# Patient Record
Sex: Male | Born: 1995 | Race: Black or African American | Hispanic: No | Marital: Single | Smoking: Former smoker
Health system: Southern US, Community
[De-identification: ages and names within clinical notes are randomized; demographics above are authoritative.]

## PROBLEM LIST (undated history)

## (undated) DIAGNOSIS — M1990 Unspecified osteoarthritis, unspecified site: Secondary | ICD-10-CM

---

## 2017-10-24 ENCOUNTER — Emergency Department (HOSPITAL_COMMUNITY): Admission: EM | Admit: 2017-10-24 | Discharge: 2017-10-24 | Discharge status: Absent without leave | Payer: Self-pay

## 2017-10-25 ENCOUNTER — Emergency Department (HOSPITAL_BASED_OUTPATIENT_CLINIC_OR_DEPARTMENT_OTHER)
Admission: EM | Admit: 2017-10-25 | Discharge: 2017-10-25 | Disposition: A | Discharge status: Home / Self Care | Payer: BLUE CROSS/BLUE SHIELD | Attending: Emergency Medicine | Admitting: Emergency Medicine

## 2017-10-25 ENCOUNTER — Emergency Department (HOSPITAL_BASED_OUTPATIENT_CLINIC_OR_DEPARTMENT_OTHER): Payer: BLUE CROSS/BLUE SHIELD

## 2017-10-25 ENCOUNTER — Encounter (HOSPITAL_BASED_OUTPATIENT_CLINIC_OR_DEPARTMENT_OTHER): Payer: Self-pay | Admitting: Emergency Medicine

## 2017-10-25 ENCOUNTER — Other Ambulatory Visit: Payer: Self-pay

## 2017-10-25 DIAGNOSIS — S6992XA Unspecified injury of left wrist, hand and finger(s), initial encounter: Secondary | ICD-10-CM | POA: Diagnosis present

## 2017-10-25 DIAGNOSIS — X509XXA Other and unspecified overexertion or strenuous movements or postures, initial encounter: Secondary | ICD-10-CM | POA: Insufficient documentation

## 2017-10-25 DIAGNOSIS — Y9371 Activity, boxing: Secondary | ICD-10-CM | POA: Diagnosis not present

## 2017-10-25 DIAGNOSIS — Y998 Other external cause status: Secondary | ICD-10-CM | POA: Insufficient documentation

## 2017-10-25 DIAGNOSIS — Y9239 Other specified sports and athletic area as the place of occurrence of the external cause: Secondary | ICD-10-CM | POA: Diagnosis not present

## 2017-10-25 DIAGNOSIS — S63502A Unspecified sprain of left wrist, initial encounter: Secondary | ICD-10-CM | POA: Diagnosis not present

## 2017-10-25 MED ORDER — IBUPROFEN 600 MG PO TABS: 600.0000 mg | ORAL_TABLET | Freq: Four times a day (QID) | ORAL | 0 refills | Status: DC | PRN

## 2017-10-25 NOTE — Discharge Instructions (Signed)
Rest and elevate the affected painful area and keep it immobilized with the sprain.  Apply cold compresses 4 times daily for the next week and complete the course of ibuprofen for pain.  As pain recedes, begin normal activities slowly as tolerated. See the sports medicine doctor if the pain doesn't improve.

## 2017-10-25 NOTE — ED Provider Notes (Signed)
[facility] EMERGENCY DEPARTMENT Provider Note   CSN: [id number] Arrival date & time: 10/25/17  0003     History   Chief Complaint Chief Complaint  Patient presents with  . Wrist Pain    HPI Jared Norris is a 21 y.o. male.  HPI  Patient comes in with chief complaint of wrist injury. Patient injured his wrist while boxing this evening.  About 2 hours after the injury the pain got intense.  Patient has no numbness or tingling.  Pain is located over the distal part of the forearm.  Patient is left-handed.  History reviewed. No pertinent past medical history.  There are no active problems to display for this patient.   History reviewed. No pertinent surgical history.     Home Medications    Prior to Admission medications   Medication Sig Start Date End Date Taking? Authorizing Provider  ibuprofen (ADVIL,MOTRIN) 600 MG tablet Take 1 tablet (600 mg total) every 6 (six) hours as needed by mouth. 10/25/17   Derwood KaplanNanavati, Rickey Farrier, MD    Family History No family history on file.  Social History Social History   Tobacco Use  . Smoking status: Never Smoker  . Smokeless tobacco: Never Used  Substance Use Topics  . Alcohol use: No    Frequency: Never  . Drug use: No     Allergies   Patient has no known allergies.   Review of Systems Review of Systems  Constitutional: Positive for activity change.  Musculoskeletal: Positive for arthralgias and joint swelling.  Hematological: Does not bruise/bleed easily.     Physical Exam Updated Vital Signs BP 121/79 (BP Location: Right Arm)   Pulse 60   Temp 98.3 F (36.8 C) (Oral)   Resp 18   SpO2 100%   Physical Exam  Constitutional: He is oriented to person, place, and time. He appears well-developed.  HENT:  Head: Atraumatic.  Eyes: EOM are normal.  Neck: Neck supple.  Cardiovascular: Normal rate and intact distal pulses.  Pulmonary/Chest: Effort normal.  Musculoskeletal: He exhibits edema and  tenderness. He exhibits no deformity.  Patient has mild tenderness over the dorsum of the distal forearm, towards the ulnar styloid.  Patient has tenderness to palpation over that region.  Patient is able to supinate, but has pain with pronation.   Neurological: He is alert and oriented to person, place, and time.  Skin: Skin is warm.  Nursing note and vitals reviewed.    ED Treatments / Results  Labs (all labs ordered are listed, but only abnormal results are displayed) Labs Reviewed - No data to display  EKG  EKG Interpretation None       Radiology Dg Wrist Complete Left  Result Date: 10/25/2017 CLINICAL DATA:  Pain after boxing EXAM: LEFT WRIST - COMPLETE 3+ VIEW COMPARISON:  None. FINDINGS: There is no evidence of fracture or dislocation. There is no evidence of arthropathy or other focal bone abnormality. Soft tissues are unremarkable. IMPRESSION: Negative. Electronically Signed   By: Jasmine PangKim  Fujinaga M.D.   On: 10/25/2017 00:23    Procedures Procedures (including critical care time)  Medications Ordered in ED Medications - No data to display   Initial Impression / Assessment and Plan / ED Course  I have reviewed the triage vital signs and the nursing notes.  Pertinent labs & imaging results that were available during my care of the patient were reviewed by me and considered in my medical decision making (see chart for details).     Patient  comes in after injuring his wrist from a boxing related injury. Patient's tenderness is limited to the dorsal part of the distal forearm/wrist area.  There is no gross deformity, mild edema appreciated.  Patient is neurovascularly intact.  X-ray does not show any acute fractures.  We will treat with RICE. F/u if not getting better.   Final Clinical Impressions(s) / ED Diagnoses   Final diagnoses:  Sprain of left wrist, initial encounter    ED Discharge Orders        Ordered    ibuprofen (ADVIL,MOTRIN) 600 MG tablet  Every 6  hours PRN     10/25/17 0056       Derwood KaplanNanavati, Kellyn Mansfield, MD 10/25/17 [id number]

## 2017-10-25 NOTE — ED Triage Notes (Signed)
Pt presents with c/o pain to left wrist after boxing tonight

## 2017-10-25 NOTE — ED Notes (Signed)
Pt verbalizes understanding of d/c instructions and denies any further need at this time. 

## 2018-09-16 ENCOUNTER — Emergency Department (HOSPITAL_COMMUNITY)
Admission: EM | Admit: 2018-09-16 | Discharge: 2018-09-16 | Disposition: A | Discharge status: Home / Self Care | Payer: BLUE CROSS/BLUE SHIELD | Attending: Emergency Medicine | Admitting: Emergency Medicine

## 2018-09-16 ENCOUNTER — Other Ambulatory Visit: Payer: Self-pay

## 2018-09-16 ENCOUNTER — Encounter (HOSPITAL_COMMUNITY): Payer: Self-pay | Admitting: Emergency Medicine

## 2018-09-16 ENCOUNTER — Ambulatory Visit (HOSPITAL_COMMUNITY)
Admission: EM | Admit: 2018-09-16 | Discharge: 2018-09-16 | Disposition: A | Discharge status: Home / Self Care | Payer: BLUE CROSS/BLUE SHIELD | Source: Home / Self Care

## 2018-09-16 DIAGNOSIS — R197 Diarrhea, unspecified: Secondary | ICD-10-CM | POA: Diagnosis not present

## 2018-09-16 DIAGNOSIS — R195 Other fecal abnormalities: Secondary | ICD-10-CM | POA: Insufficient documentation

## 2018-09-16 DIAGNOSIS — Z87891 Personal history of nicotine dependence: Secondary | ICD-10-CM | POA: Diagnosis not present

## 2018-09-16 DIAGNOSIS — R112 Nausea with vomiting, unspecified: Secondary | ICD-10-CM

## 2018-09-16 DIAGNOSIS — K921 Melena: Secondary | ICD-10-CM

## 2018-09-16 HISTORY — DX: Unspecified osteoarthritis, unspecified site: M19.90

## 2018-09-16 LAB — COMPREHENSIVE METABOLIC PANEL
ALT: 25 U/L (ref 0–44)
AST: 37 U/L (ref 15–41)
Albumin: 4.3 g/dL (ref 3.5–5.0)
Alkaline Phosphatase: 58 U/L (ref 38–126)
Anion gap: 12 (ref 5–15)
BILIRUBIN TOTAL: 0.7 mg/dL (ref 0.3–1.2)
BUN: 8 mg/dL (ref 6–20)
CO2: 24 mmol/L (ref 22–32)
CREATININE: 1.08 mg/dL (ref 0.61–1.24)
Calcium: 10.2 mg/dL (ref 8.9–10.3)
Chloride: 103 mmol/L (ref 98–111)
GFR calc Af Amer: >60 mL/min (ref >60)
Glucose, Bld: 114 mg/dL — ABNORMAL HIGH (ref 70–99)
Potassium: 4.2 mmol/L (ref 3.5–5.1)
Sodium: 139 mmol/L (ref 135–145)
TOTAL PROTEIN: 7.4 g/dL (ref 6.5–8.1)

## 2018-09-16 LAB — CBC
HCT: 47.1 % (ref 39.0–52.0)
Hemoglobin: 15.6 g/dL (ref 13.0–17.0)
MCH: 30 pg (ref 26.0–34.0)
MCHC: 33.1 g/dL (ref 30.0–36.0)
MCV: 90.6 fL (ref 78.0–100.0)
PLATELETS: 217 10*3/uL (ref 150–400)
RBC: 5.2 MIL/uL (ref 4.22–5.81)
RDW: 11.9 % (ref 11.5–15.5)
WBC: 8.2 10*3/uL (ref 4.0–10.5)

## 2018-09-16 LAB — LIPASE, BLOOD: Lipase: 90 U/L — ABNORMAL HIGH (ref 11–51)

## 2018-09-16 MED ORDER — ONDANSETRON 4 MG PO TBDP
4.0000 mg | ORAL_TABLET | Freq: Once | ORAL | Status: DC | PRN
  Filled 2018-09-16: qty 1

## 2018-09-16 MED ORDER — KETOROLAC TROMETHAMINE 15 MG/ML IJ SOLN
15.0000 mg | Freq: Once | INTRAMUSCULAR | Status: DC
  Filled 2018-09-16: qty 1

## 2018-09-16 MED ORDER — ONDANSETRON HCL 4 MG/2ML IJ SOLN
4.0000 mg | Freq: Once | INTRAMUSCULAR | Status: AC
  Administered 2018-09-16: 4 mg via INTRAVENOUS
  Filled 2018-09-16: qty 2

## 2018-09-16 MED ORDER — ONDANSETRON 4 MG PO TBDP: ORAL_TABLET | ORAL | 0 refills | Status: AC

## 2018-09-16 MED ORDER — SODIUM CHLORIDE 0.9 % IV BOLUS
1000.0000 mL | Freq: Once | INTRAVENOUS | Status: AC
  Administered 2018-09-16: 1000 mL via INTRAVENOUS

## 2018-09-16 NOTE — ED Triage Notes (Signed)
Patient to ED c/o sudden onset lower abdominal pain with chills, N/V, and bright red blood in stools onset today. Patient states he felt unwell, "like flu-like symptoms" Monday to Wednesday and felt better until this morning. Vomited x 1 in triage. Patient denies history of blood in stools. Afebrile in triage.

## 2018-09-16 NOTE — ED Provider Notes (Signed)
[facility] EMERGENCY DEPARTMENT Provider Note   CSN: [id number] Arrival date & time: 09/16/18  1630     History   Chief Complaint Chief Complaint  Patient presents with  . Emesis    HPI Kentrail Shew is a 22 y.o. male.  Patient with no significant medical history except for asthma presents with nausea vomiting diarrhea and chills since this morning.  Bright red blood in the stools since yesterday.  Flulike symptoms.  Patient is vomited multiple times today.  No sick contacts known.  No new foods or travel.  No new medications.     Past Medical History:  Diagnosis Date  . Arthritis     There are no active problems to display for this patient.   History reviewed. No pertinent surgical history.      Home Medications    Prior to Admission medications   Medication Sig Start Date End Date Taking? Authorizing Provider  ibuprofen (ADVIL,MOTRIN) 600 MG tablet Take 1 tablet (600 mg total) every 6 (six) hours as needed by mouth. 10/25/17   Derwood Kaplan, MD  ondansetron (ZOFRAN ODT) 4 MG disintegrating tablet 4mg  ODT q4 hours prn nausea/vomit 09/16/18   Blane Ohara, MD    Family History No family history on file.  Social History Social History   Tobacco Use  . Smoking status: Former Smoker    Types: E-cigarettes    Last attempt to quit: 08/16/2018    Years since quitting: 0.0  . Smokeless tobacco: Never Used  Substance Use Topics  . Alcohol use: Yes    Frequency: Never    Comment: socially  . Drug use: Yes    Types: Marijuana    Comment: socially     Allergies   Patient has no known allergies.   Review of Systems Review of Systems  Constitutional: Positive for appetite change and chills. Negative for fever.  HENT: Negative for congestion.   Eyes: Negative for visual disturbance.  Respiratory: Negative for shortness of breath.   Cardiovascular: Negative for chest pain.  Gastrointestinal: Positive for abdominal pain, blood in  stool, nausea and vomiting.  Genitourinary: Negative for dysuria and flank pain.  Musculoskeletal: Negative for back pain, neck pain and neck stiffness.  Skin: Negative for rash.  Neurological: Positive for light-headedness. Negative for headaches.     Physical Exam Updated Vital Signs BP 122/71   Pulse 69   Temp (!) 97.5 F (36.4 C) (Oral)   Resp 16   Ht 6' (1.829 m)   Wt 74.8 kg   SpO2 100%   BMI 22.38 kg/m   Physical Exam  Constitutional: He is oriented to person, place, and time. He appears well-developed and well-nourished.  HENT:  Head: Normocephalic and atraumatic.  Dry mucous membranes  Eyes: Conjunctivae are normal. Right eye exhibits no discharge. Left eye exhibits no discharge.  Neck: Normal range of motion. Neck supple. No tracheal deviation present.  Cardiovascular: Normal rate and regular rhythm.  Pulmonary/Chest: Effort normal and breath sounds normal.  Abdominal: Soft. He exhibits no distension. There is no tenderness. There is no guarding.  Musculoskeletal: He exhibits no edema.  Neurological: He is alert and oriented to person, place, and time.  Skin: Skin is warm. No rash noted.  Psychiatric: He has a normal mood and affect.  Nursing note and vitals reviewed.    ED Treatments / Results  Labs (all labs ordered are listed, but only abnormal results are displayed) Labs Reviewed  LIPASE, BLOOD - Abnormal; Notable for  the following components:      Result Value   Lipase 90 (*)    All other components within normal limits  COMPREHENSIVE METABOLIC PANEL - Abnormal; Notable for the following components:   Glucose, Bld 114 (*)    All other components within normal limits  GASTROINTESTINAL PANEL BY PCR, STOOL (REPLACES STOOL CULTURE)  CBC  URINALYSIS, ROUTINE W REFLEX MICROSCOPIC    EKG None  Radiology No results found.  Procedures Procedures (including critical care time)  Medications Ordered in ED Medications  ondansetron (ZOFRAN-ODT)  disintegrating tablet 4 mg (has no administration in time range)  ketorolac (TORADOL) 15 MG/ML injection 15 mg (has no administration in time range)  sodium chloride 0.9 % bolus 1,000 mL (1,000 mLs Intravenous New Bag/Given 09/16/18 1850)  ondansetron (ZOFRAN) injection 4 mg (4 mg Intravenous Given 09/16/18 1906)     Initial Impression / Assessment and Plan / ED Course  I have reviewed the triage vital signs and the nursing notes.  Pertinent labs & imaging results that were available during my care of the patient were reviewed by me and considered in my medical decision making (see chart for details).    Patient presents with clinically gastroenteritis concern for infectious with blood in the stools.  Plan for screening blood work, IV fluids, antiemetics, stool culture to be followed up outpatient.  Blood work reviewed unremarkable kidney function normal.  Lipase minimally elevated and patient has no tenderness at this time I do not feel it is clinically significant.  Patient improved significantly tolerated oral and recheck.  Discussed follow-up outpatient and reasons to return.  Final Clinical Impressions(s) / ED Diagnoses   Final diagnoses:  Nausea vomiting and diarrhea  Blood in stool    ED Discharge Orders         Ordered    ondansetron (ZOFRAN ODT) 4 MG disintegrating tablet     09/16/18 2008           Blane Ohara, MD 09/16/18 2009

## 2018-09-16 NOTE — ED Notes (Addendum)
Pt found on bathroom floor. Pt said he was vomiting and slowly lowered himself to the floor. Pt was able to get up with out assistance and walk to triage chairs. No vomit noticed in bathroom.

## 2018-09-16 NOTE — Discharge Instructions (Signed)
Follow-up closely with primary doctor and follow-up stool culture results.  Take Zofran as needed for nausea vomiting. Gradually increase oral fluid intake.

## 2019-03-23 IMAGING — DX DG WRIST COMPLETE 3+V*L*
4 series · 4 of 4 positions shown · non-contrast
Comparison: None.

CLINICAL DATA: Pain after boxing

EXAM:
LEFT WRIST - COMPLETE 3+ VIEW

[wrist pa]
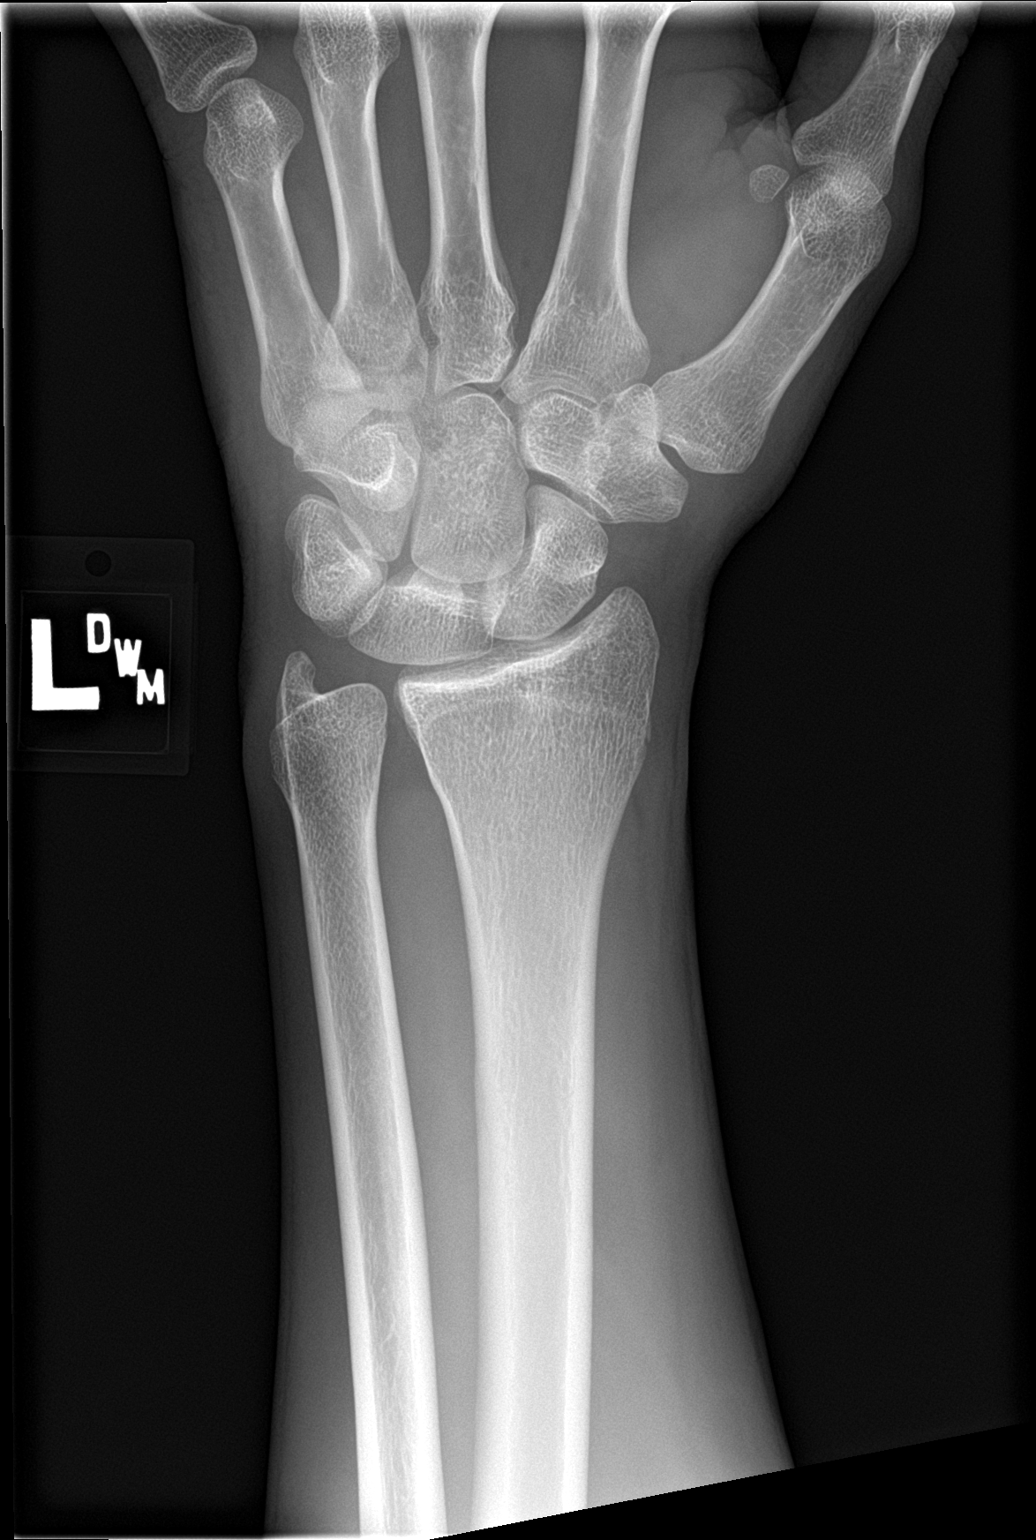

[wrist obl]
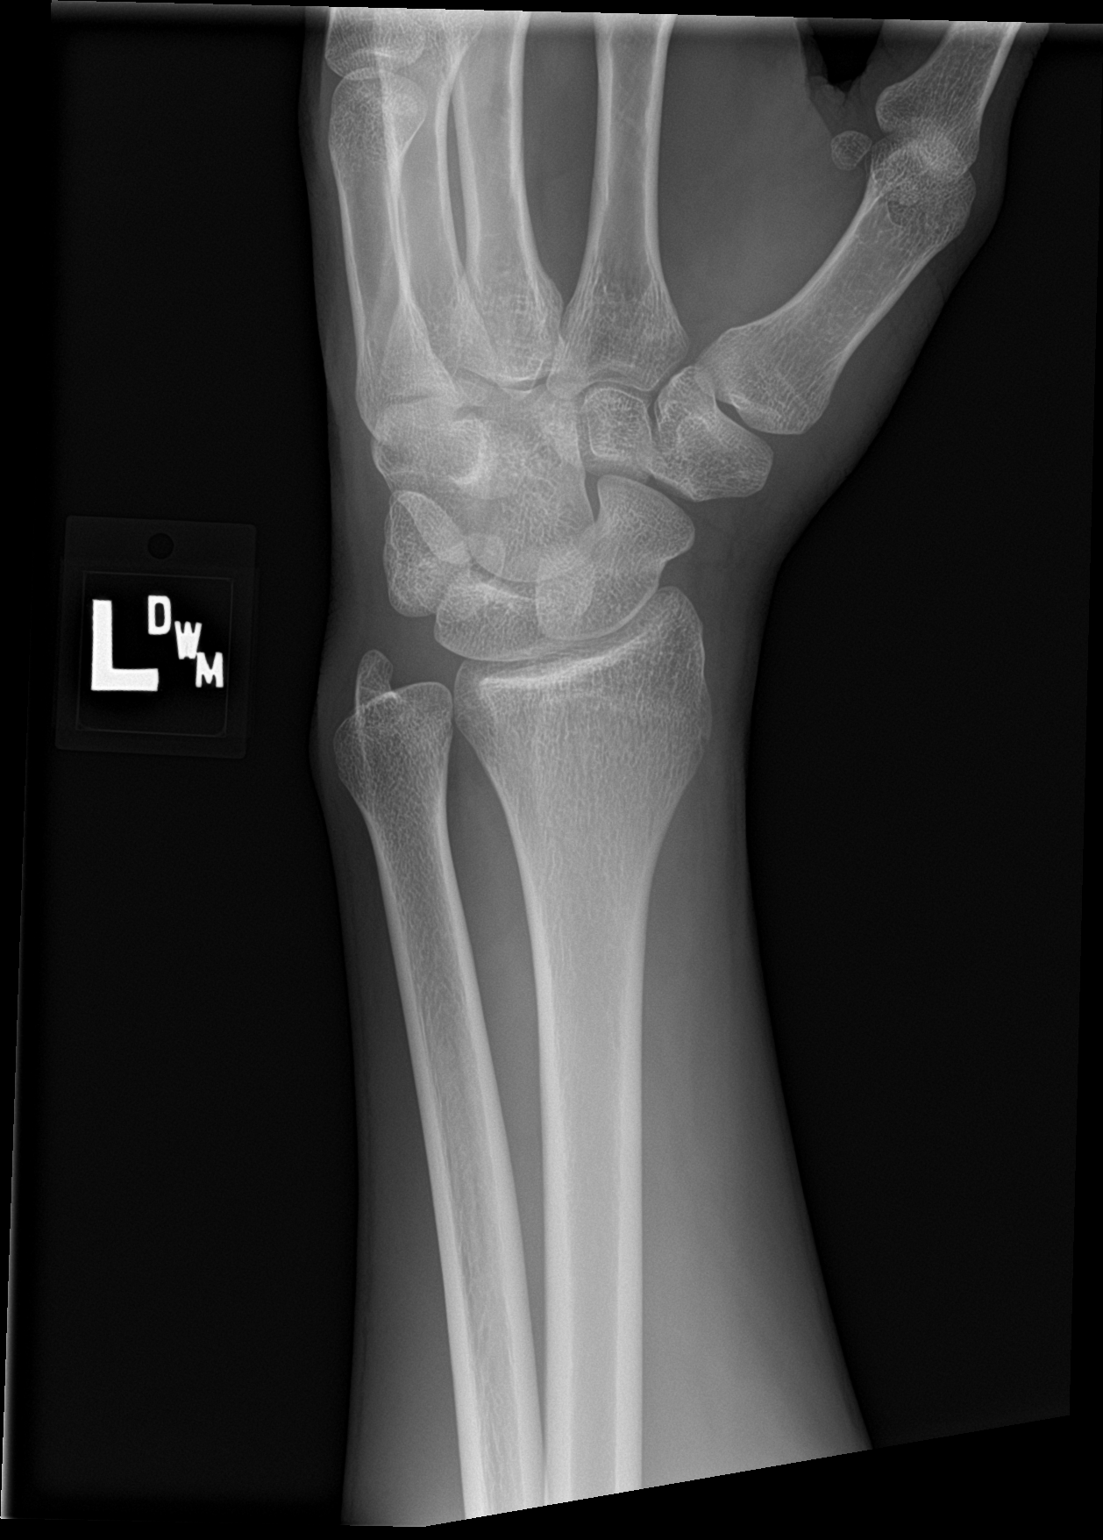

[wrist navicular]
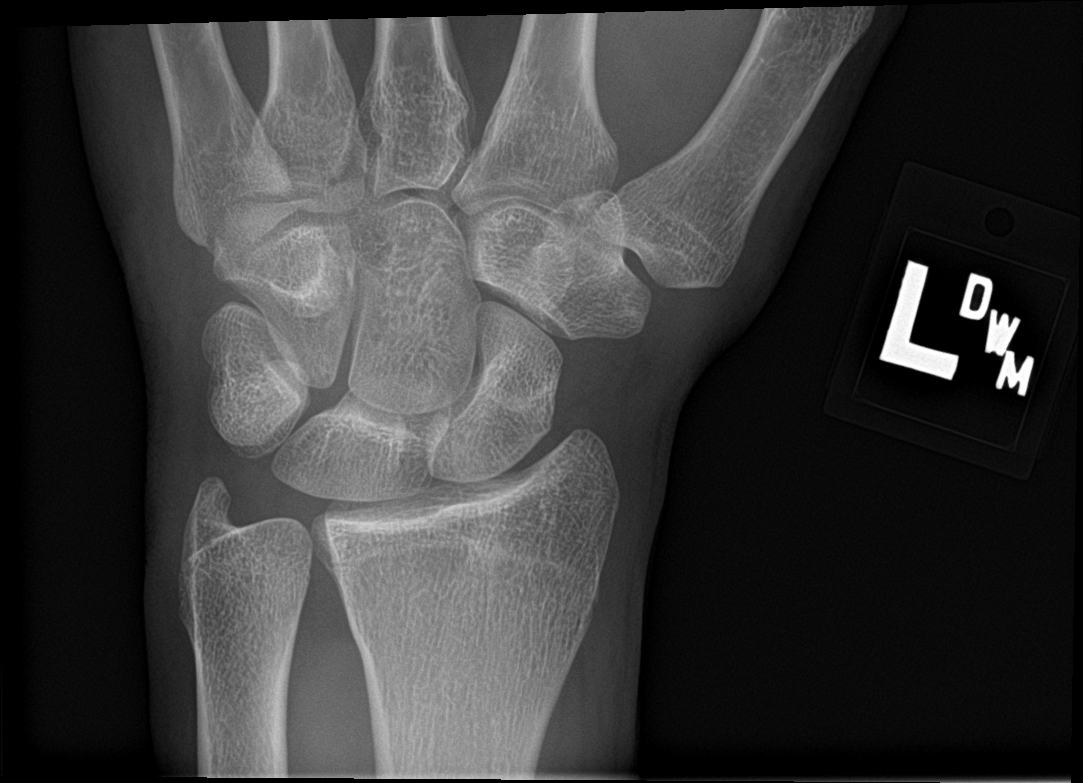

[wrist lat]
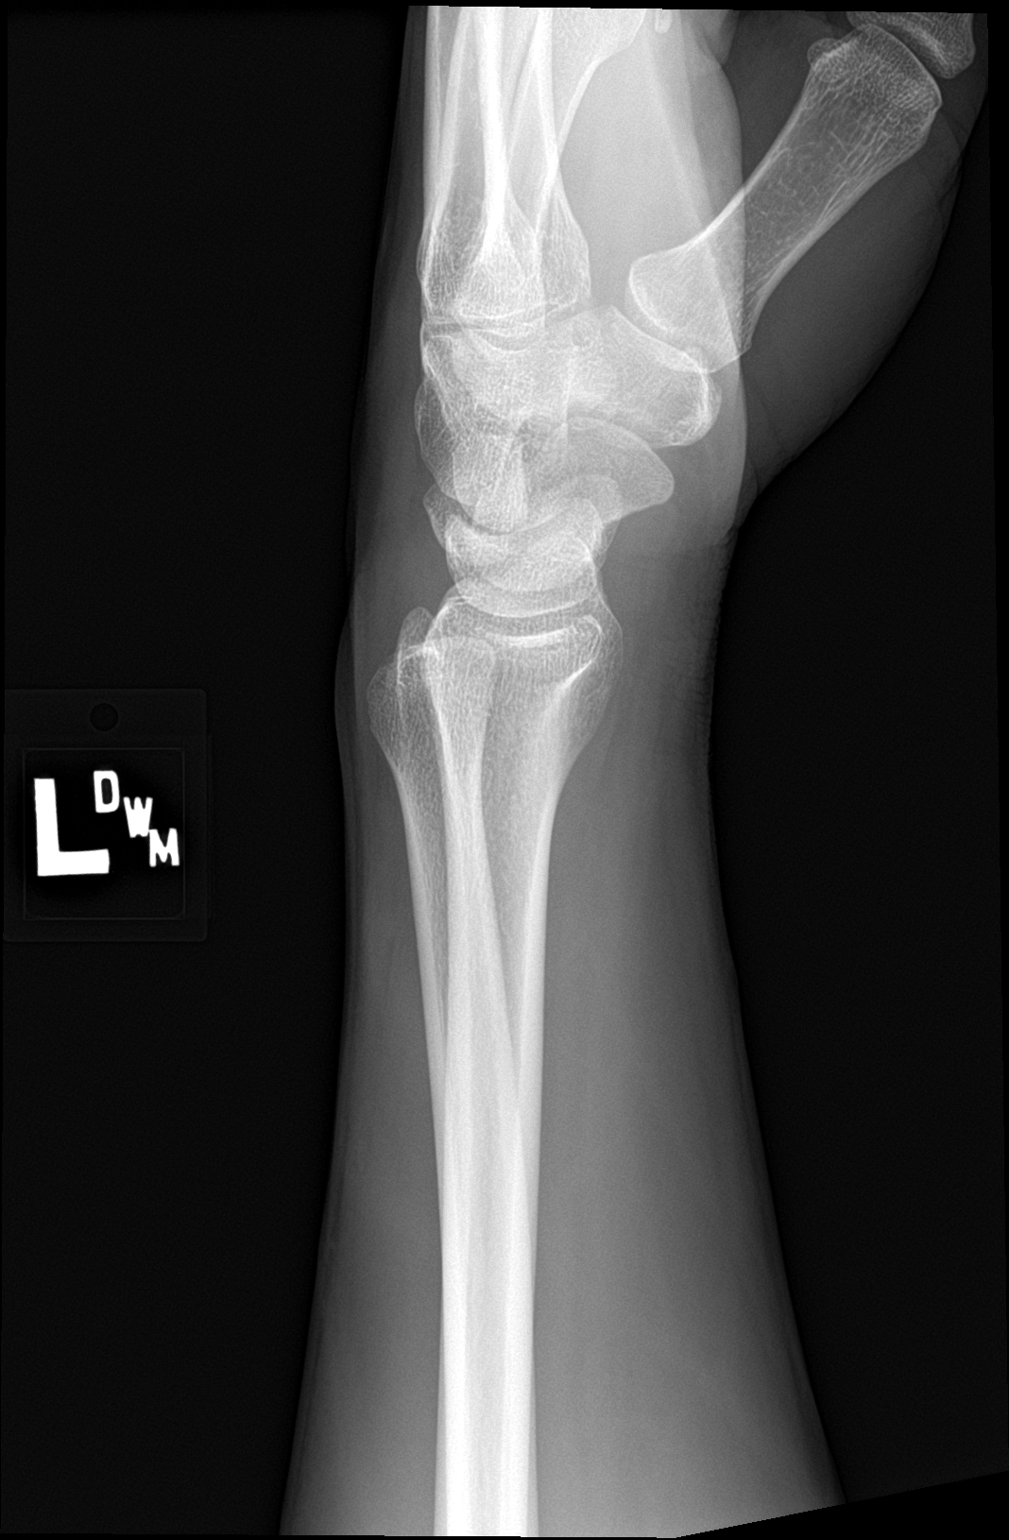

[4 of 4 positions shown; findings below may reference images not displayed]

FINDINGS: There is no evidence of fracture or dislocation. There is no
evidence of arthropathy or other focal bone abnormality. Soft
tissues are unremarkable.
IMPRESSION: Negative.

## 2022-02-07 ENCOUNTER — Telehealth: Payer: BLUE CROSS/BLUE SHIELD | Admitting: Family

## 2022-02-07 NOTE — Progress Notes (Signed)
Attempted to connect with patent and called with no answer. Will close chart.   Jannifer Rodney, FNP

## 2023-12-13 ENCOUNTER — Emergency Department (HOSPITAL_BASED_OUTPATIENT_CLINIC_OR_DEPARTMENT_OTHER)
Admission: EM | Admit: 2023-12-13 | Discharge: 2023-12-13 | Disposition: A | Discharge status: Home / Self Care | Payer: PRIVATE HEALTH INSURANCE | Attending: Emergency Medicine | Admitting: Emergency Medicine

## 2023-12-13 ENCOUNTER — Other Ambulatory Visit: Payer: Self-pay

## 2023-12-13 ENCOUNTER — Encounter (HOSPITAL_BASED_OUTPATIENT_CLINIC_OR_DEPARTMENT_OTHER): Payer: Self-pay

## 2023-12-13 DIAGNOSIS — S161XXA Strain of muscle, fascia and tendon at neck level, initial encounter: Secondary | ICD-10-CM | POA: Insufficient documentation

## 2023-12-13 DIAGNOSIS — Y92410 Unspecified street and highway as the place of occurrence of the external cause: Secondary | ICD-10-CM | POA: Insufficient documentation

## 2023-12-13 DIAGNOSIS — M542 Cervicalgia: Secondary | ICD-10-CM | POA: Diagnosis present

## 2023-12-13 MED ORDER — IBUPROFEN 400 MG PO TABS: 600.0000 mg | ORAL_TABLET | Freq: Once | ORAL | Status: DC

## 2023-12-13 MED ORDER — IBUPROFEN 600 MG PO TABS: 600.0000 mg | ORAL_TABLET | Freq: Four times a day (QID) | ORAL | 0 refills | Status: AC | PRN
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
